# Patient Record
Sex: Male | Born: 1975 | Race: White | Hispanic: No | Marital: Married | Smoking: Never smoker
Health system: Southern US, Community
[De-identification: ages and names within clinical notes are randomized; demographics above are authoritative.]

## PROBLEM LIST (undated history)

## (undated) DIAGNOSIS — T7840XA Allergy, unspecified, initial encounter: Secondary | ICD-10-CM

## (undated) DIAGNOSIS — J45909 Unspecified asthma, uncomplicated: Secondary | ICD-10-CM

## (undated) HISTORY — DX: Allergy, unspecified, initial encounter: T78.40XA

## (undated) HISTORY — DX: Unspecified asthma, uncomplicated: J45.909

## (undated) HISTORY — PX: HERNIA REPAIR: SHX51

---

## 2012-10-13 ENCOUNTER — Ambulatory Visit (INDEPENDENT_AMBULATORY_CARE_PROVIDER_SITE_OTHER): Payer: BC Managed Care – PPO | Admitting: Internal Medicine

## 2012-10-13 VITALS — BP 140/92 | HR 95 | Temp 98.6°F | Resp 18 | Ht 71.0 in | Wt 258.0 lb

## 2012-10-13 DIAGNOSIS — H9202 Otalgia, left ear: Secondary | ICD-10-CM

## 2012-10-13 DIAGNOSIS — H6120 Impacted cerumen, unspecified ear: Secondary | ICD-10-CM

## 2012-10-13 DIAGNOSIS — H60399 Other infective otitis externa, unspecified ear: Secondary | ICD-10-CM

## 2012-10-13 DIAGNOSIS — H9209 Otalgia, unspecified ear: Secondary | ICD-10-CM

## 2012-10-13 DIAGNOSIS — J019 Acute sinusitis, unspecified: Secondary | ICD-10-CM

## 2012-10-13 DIAGNOSIS — Z6836 Body mass index (BMI) 36.0-36.9, adult: Secondary | ICD-10-CM | POA: Insufficient documentation

## 2012-10-13 DIAGNOSIS — H60392 Other infective otitis externa, left ear: Secondary | ICD-10-CM

## 2012-10-13 DIAGNOSIS — H6122 Impacted cerumen, left ear: Secondary | ICD-10-CM

## 2012-10-13 MED ORDER — NEOMYCIN-POLYMYXIN-HC 3.5-10000-1 OT SUSP: 3.0000 [drp] | Freq: Four times a day (QID) | OTIC | Status: AC

## 2012-10-13 MED ORDER — AMOXICILLIN 500 MG PO CAPS: 1000.0000 mg | ORAL_CAPSULE | Freq: Two times a day (BID) | ORAL | Status: AC

## 2012-10-13 MED ORDER — HYDROCODONE-HOMATROPINE 5-1.5 MG/5ML PO SYRP: 5.0000 mL | ORAL_SOLUTION | Freq: Four times a day (QID) | ORAL | Status: AC | PRN

## 2012-10-13 NOTE — Progress Notes (Signed)
  Subjective:    Patient ID: Drew Lewis, male    DOB: [birth year], 37 y.o.   MRN: [id number]  HPI complaining of one month history of progressively worsening congestion Started with a cough and a sore throat with fever for 5 days This improved but he continued with congestion and postnasal drainage and a nighttime cough/interferes with sleep Left frontal pain No night sweats Worked as a problem due to fatigue History of fall allergies on Zyrtec and Benadryl Over the past 3 days has had pain in his left ear with decreased hearing/has a history of frequent cerumen impaction in that ear  Review of Systems In the last week has had no fever chills or night sweats No bloody nasal discharge No wheezing Nonproductive cough No chest pain Positive fatigue/appetite okay No weight loss No GI symptoms    Objective:   Physical Exam BP 140/92  Pulse 95  Temp(Src) 98.6 F (37 C) (Oral)  Resp 18  Ht 5\' 11"  (1.803 m)  Wt 258 lb (117.028 kg)  BMI 36 kg/m2  SpO2 99% Pupils equal round reactive to light and accommodation/conjunctiva clear Left canal occluded with wax/right canal and TM clear Nares boggy with purulent Throat clear No nodes or thyromegaly Chest clear to auscultation No wheezing with forced expiration  Procedure= left ear irrigated successfully/post irrigation the canal is red and excoriated throughout/tympanic membrane is clear    Assessment & Plan:  Acute sinusitis, unspecified - Plan: amoxicillin (AMOXIL) 500 MG capsule  Otalgia of left ear - Plan: neomycin-polymyxin-hydrocortisone (CORTISPORIN) 3.5-10000-1 otic suspension  Cerumen impaction, left  Otitis, externa, infective, left - Plan: neomycin-polymyxin-hydrocortisone (CORTISPORIN) 3.5-10000-1 otic suspension  Meds ordered this encounter  Medications  . cetirizine (ZYRTEC) 10 MG tablet    Sig: Take 10 mg by mouth daily.  . DiphenhydrAMINE HCl (BENADRYL ALLERGY PO)    Sig: Take by mouth as needed.  Marland Kitchen  HYDROcodone-homatropine (HYCODAN) 5-1.5 MG/5ML syrup    Sig: Take 5 mLs by mouth every 6 (six) hours as needed for cough.    Dispense:  120 mL    Refill:  0  . amoxicillin (AMOXIL) 500 MG capsule    Sig: Take 2 capsules (1,000 mg total) by mouth 2 (two) times daily.    Dispense:  40 capsule    Refill:  0  . neomycin-polymyxin-hydrocortisone (CORTISPORIN) 3.5-10000-1 otic suspension    Sig: Place 3 drops into the left ear 4 (four) times daily.    Dispense:  10 mL    Refill:  0

## 2013-01-27 ENCOUNTER — Ambulatory Visit (INDEPENDENT_AMBULATORY_CARE_PROVIDER_SITE_OTHER): Payer: BC Managed Care – PPO | Admitting: Family Medicine

## 2013-01-27 VITALS — BP 126/80 | HR 79 | Temp 99.3°F | Resp 18 | Ht 71.0 in | Wt 258.0 lb

## 2013-01-27 DIAGNOSIS — R197 Diarrhea, unspecified: Secondary | ICD-10-CM

## 2013-01-27 DIAGNOSIS — A088 Other specified intestinal infections: Secondary | ICD-10-CM

## 2013-01-27 DIAGNOSIS — R109 Unspecified abdominal pain: Secondary | ICD-10-CM

## 2013-01-27 DIAGNOSIS — A084 Viral intestinal infection, unspecified: Secondary | ICD-10-CM

## 2013-01-27 DIAGNOSIS — R112 Nausea with vomiting, unspecified: Secondary | ICD-10-CM

## 2013-01-27 LAB — POCT CBC
Granulocyte percent: 62 %G (ref 37–80)
HEMATOCRIT: 51.5 % (ref 43.5–53.7)
Hemoglobin: 16.6 g/dL (ref 14.1–18.1)
LYMPH, POC: 2.5 (ref 0.6–3.4)
MCH: 28.8 pg (ref 27–31.2)
MCHC: 32.2 g/dL (ref 31.8–35.4)
MCV: 89.2 fL (ref 80–97)
MID (cbc): 0.8 (ref 0–0.9)
MPV: 8.8 fL (ref 0–99.8)
POC Granulocyte: 5.3 (ref 2–6.9)
POC LYMPH %: 29 % (ref 10–50)
POC MID %: 9 %M (ref 0–12)
Platelet Count, POC: 351 10*3/uL (ref 142–424)
RBC: 5.77 M/uL (ref 4.69–6.13)
RDW, POC: 13.1 %
WBC: 8.6 10*3/uL (ref 4.6–10.2)

## 2013-01-27 LAB — POCT URINALYSIS DIPSTICK
Glucose, UA: NEGATIVE
KETONES UA: NEGATIVE
Leukocytes, UA: NEGATIVE
NITRITE UA: NEGATIVE
PH UA: 5.5
Protein, UA: NEGATIVE
RBC UA: NEGATIVE
Spec Grav, UA: 1.025
Urobilinogen, UA: 0.2

## 2013-01-27 LAB — POCT UA - MICROSCOPIC ONLY
CRYSTALS, UR, HPF, POC: NEGATIVE
Casts, Ur, LPF, POC: NEGATIVE
EPITHELIAL CELLS, URINE PER MICROSCOPY: NEGATIVE
Yeast, UA: NEGATIVE

## 2013-01-27 NOTE — Patient Instructions (Signed)
Drink plenty of fluids. Nonalcoholic and non-dairy.  Return or go to the emergency room at anytime if acutely worse  Zofran dissolve one in mouth every 4-6 hours if needed for nausea and vomiting  Take Imodium if the diarrhea gets bad again  Return at any time if needed

## 2013-01-27 NOTE — Progress Notes (Signed)
Subjective: Generally healthy young man his started getting sick Saturday. He had diarrhea off and on over the last 4 days. It has not been constant diarrhea, just episodes of it. He has had some nonspecific abdominal pains. He has vomited heavily yesterday. He saw something look like blood in his vomitus. Today he has not eaten anything and has had neither vomiting or diarrhea. Only a sip of water. He has been drinking enough that he has been urinating.  Not on any regular medicines except for his Zyrtec and occasional Benadryl.  Objective: Particular man in no major distress. Moderately overweight. TMs normal the right common occluded with cerumen on the left. His throat has a little erythema along the left tonsillar pillar but his throat is not sore. He has had little runny nose. His neck is supple without nodes or thyromegaly. Chest is clear to auscultation. Heart regular without murmurs. Abdomen has normal bowel sounds, soft without organomegaly or masses but has mild generalized mid and low abdominal tenderness  Plan: CBC and urinalysis  Results for orders placed in visit on 01/27/13  POCT CBC      Result Value Range   WBC 8.6  4.6 - 10.2 K/uL   Lymph, poc 2.5  0.6 - 3.4   POC LYMPH PERCENT 29.0  10 - 50 %L   MID (cbc) 0.8  0 - 0.9   POC MID % 9.0  0 - 12 %M   POC Granulocyte 5.3  2 - 6.9   Granulocyte percent 62.0  37 - 80 %G   RBC 5.77  4.69 - 6.13 M/uL   Hemoglobin 16.6  14.1 - 18.1 g/dL   HCT, POC 16.151.5  09.643.5 - 53.7 %   MCV 89.2  80 - 97 fL   MCH, POC 28.8  27 - 31.2 pg   MCHC 32.2  31.8 - 35.4 g/dL   RDW, POC 04.513.1     Platelet Count, POC 351  142 - 424 K/uL   MPV 8.8  0 - 99.8 fL  POCT UA - MICROSCOPIC ONLY      Result Value Range   WBC, Ur, HPF, POC 0-2     RBC, urine, microscopic 0-1     Bacteria, U Microscopic trace     Mucus, UA moderate     Epithelial cells, urine per micros neg     Crystals, Ur, HPF, POC neg     Casts, Ur, LPF, POC neg     Yeast, UA neg    POCT  URINALYSIS DIPSTICK      Result Value Range   Color, UA yellow     Clarity, UA clear     Glucose, UA neg     Bilirubin, UA small     Ketones, UA neg     Spec Grav, UA 1.025     Blood, UA neg     pH, UA 5.5     Protein, UA neg     Urobilinogen, UA 0.2     Nitrite, UA neg     Leukocytes, UA Negative     Assessment: Gastroenteritis Vomiting Diarrhea Mild dehydration  Plan: Zofran Fluids Imodium if necessary

## 2014-01-25 ENCOUNTER — Ambulatory Visit (INDEPENDENT_AMBULATORY_CARE_PROVIDER_SITE_OTHER): Payer: BLUE CROSS/BLUE SHIELD

## 2014-01-25 ENCOUNTER — Ambulatory Visit (INDEPENDENT_AMBULATORY_CARE_PROVIDER_SITE_OTHER): Payer: BLUE CROSS/BLUE SHIELD | Admitting: Family Medicine

## 2014-01-25 VITALS — BP 128/90 | HR 100 | Temp 98.0°F | Resp 18 | Ht 71.0 in | Wt 270.0 lb

## 2014-01-25 DIAGNOSIS — Z91048 Other nonmedicinal substance allergy status: Secondary | ICD-10-CM

## 2014-01-25 DIAGNOSIS — J988 Other specified respiratory disorders: Secondary | ICD-10-CM

## 2014-01-25 DIAGNOSIS — J22 Unspecified acute lower respiratory infection: Secondary | ICD-10-CM

## 2014-01-25 DIAGNOSIS — R05 Cough: Secondary | ICD-10-CM

## 2014-01-25 DIAGNOSIS — R062 Wheezing: Secondary | ICD-10-CM

## 2014-01-25 DIAGNOSIS — Z9109 Other allergy status, other than to drugs and biological substances: Secondary | ICD-10-CM

## 2014-01-25 DIAGNOSIS — J0100 Acute maxillary sinusitis, unspecified: Secondary | ICD-10-CM

## 2014-01-25 DIAGNOSIS — R059 Cough, unspecified: Secondary | ICD-10-CM

## 2014-01-25 MED ORDER — FLUTICASONE PROPIONATE 50 MCG/ACT NA SUSP: 2.0000 | Freq: Every day | NASAL | Status: AC

## 2014-01-25 MED ORDER — ALBUTEROL SULFATE HFA 108 (90 BASE) MCG/ACT IN AERS: 2.0000 | INHALATION_SPRAY | Freq: Four times a day (QID) | RESPIRATORY_TRACT | Status: AC | PRN

## 2014-01-25 MED ORDER — AZITHROMYCIN 250 MG PO TABS: ORAL_TABLET | ORAL | Status: AC

## 2014-01-25 MED ORDER — HYDROCODONE-HOMATROPINE 5-1.5 MG/5ML PO SYRP
5.0000 mL | ORAL_SOLUTION | Freq: Every evening | ORAL | Status: AC | PRN
Start: 2014-01-25 — End: ?

## 2014-01-25 NOTE — Progress Notes (Signed)
Chief Complaint:  Chief Complaint  Patient presents with  . Cough    x3 weeks, dry and greenish mucous   . Nasal Congestion  . Generalized Body Aches    HPI: Drew Lewis is a 39 y.o. male who is here for 3 week history of cough, productive and dry. He also has some nausea, no vomiting yet, he had pains and aches everywhere initially , no fevers, has had chills.   He has asthma and allergies, he has been wheezing . He has tried  chlorascetin and now worse. HE has not taken his albuterol inhaler because it makes him very jittery and he states his asthma is under control, not like when he was a kid and he had to be in the hospital all the time. He is using albuterol abotu 3-4 tiems per month. NO wheezing currently.   Past Medical History  Diagnosis Date  . Asthma   . Allergy    Past Surgical History  Procedure Laterality Date  . Hernia repair     History   Social History  . Marital Status: Married    Spouse Name: N/A    Number of Children: N/A  . Years of Education: N/A   Social History Main Topics  . Smoking status: Never Smoker   . Smokeless tobacco: None  . Alcohol Use: Yes  . Drug Use: No  . Sexual Activity: None   Other Topics Concern  . None   Social History Narrative   Family History  Problem Relation Age of Onset  . Heart murmur Mother   . Diabetes Father   . Hypertension Father   . Cancer Father   . Migraines Daughter   . Schizophrenia Paternal Uncle   . Cancer Maternal Grandmother   . Lymphoma Maternal Grandmother   . Alzheimer's disease Maternal Grandfather   . Cancer Paternal Grandfather   . Leukemia Paternal Grandfather    No Known Allergies Prior to Admission medications   Medication Sig Start Date End Date Taking? Authorizing Provider  cetirizine (ZYRTEC) 10 MG tablet Take 10 mg by mouth daily.   Yes Historical Provider, MD  DiphenhydrAMINE HCl (BENADRYL ALLERGY PO) Take by mouth as needed.   Yes Historical Provider, MD    HYDROcodone-homatropine (HYCODAN) 5-1.5 MG/5ML syrup Take 5 mLs by mouth every 6 (six) hours as needed for cough. Patient not taking: Reported on 01/25/2014 10/13/12   Tonye Pearson, MD  neomycin-polymyxin-hydrocortisone (CORTISPORIN) 3.5-10000-1 otic suspension Place 3 drops into the left ear 4 (four) times daily. Patient not taking: Reported on 01/25/2014 10/13/12   Tonye Pearson, MD     ROS: The patient denies fevers, night sweats, unintentional weight loss,  palpitations, wheezing, dyspnea on exertion,  vomiting, abdominal pain, dysuria, hematuria, melena, numbness, weakness, or tingling.   All other systems have been reviewed and were otherwise negative with the exception of those mentioned in the HPI and as above.    PHYSICAL EXAM: Filed Vitals:   01/25/14 1305  BP: 128/90  Pulse: 100  Temp: 98 F (36.7 C)  Resp: 18   Filed Vitals:   01/25/14 1305  Height:  (1.803 m)  Weight: 270 lb (122.471 kg)   Body mass index is 37.67 kg/(m^2).  General: Alert, no acute distress HEENT:  Normocephalic, atraumatic, oropharynx patent. EOMI, PERRLA Erythematous throat, no exudates, TM normal, + sinus tenderness max, + erythematous/boggy nasal mucosa Cardiovascular:  Regular rate and rhythm, no rubs murmurs or gallops.  No Carotid bruits, radial pulse intact. No pedal edema.  Respiratory: Clear to auscultation bilaterally.  No wheezes, rales, or rhonchi.  No cyanosis, no use of accessory musculature GI: No organomegaly, abdomen is soft and non-tender, positive bowel sounds.  No masses. Skin: No rashes. Neurologic: Facial musculature symmetric. Psychiatric: Patient is appropriate throughout our interaction. Lymphatic: No cervical lymphadenopathy Musculoskeletal: Gait intact.   LABS: Results for orders placed or performed in visit on 01/27/13  POCT CBC  Result Value Ref Range   WBC 8.6 4.6 - 10.2 K/uL   Lymph, poc 2.5 0.6 - 3.4   POC LYMPH PERCENT 29.0 10 - 50 %L   MID  (cbc) 0.8 0 - 0.9   POC MID % 9.0 0 - 12 %M   POC Granulocyte 5.3 2 - 6.9   Granulocyte percent 62.0 37 - 80 %G   RBC 5.77 4.69 - 6.13 M/uL   Hemoglobin 16.6 14.1 - 18.1 g/dL   HCT, POC 10.251.5 72.543.5 - 53.7 %   MCV 89.2 80 - 97 fL   MCH, POC 28.8 27 - 31.2 pg   MCHC 32.2 31.8 - 35.4 g/dL   RDW, POC 36.613.1 %   Platelet Count, POC 351 142 - 424 K/uL   MPV 8.8 0 - 99.8 fL  POCT UA - Microscopic Only  Result Value Ref Range   WBC, Ur, HPF, POC 0-2    RBC, urine, microscopic 0-1    Bacteria, U Microscopic trace    Mucus, UA moderate    Epithelial cells, urine per micros neg    Crystals, Ur, HPF, POC neg    Casts, Ur, LPF, POC neg    Yeast, UA neg   POCT urinalysis dipstick  Result Value Ref Range   Color, UA yellow    Clarity, UA clear    Glucose, UA neg    Bilirubin, UA small    Ketones, UA neg    Spec Grav, UA 1.025    Blood, UA neg    pH, UA 5.5    Protein, UA neg    Urobilinogen, UA 0.2    Nitrite, UA neg    Leukocytes, UA Negative      EKG/XRAY:   Primary [facility]. Right hazy iniftrate vs vascular changes due to bronchitic changes + horizontal fissue without e/o effusion   ASSESSMENT/PLAN: Encounter Diagnoses  Name Primary?  . Acute maxillary sinusitis, recurrence not specified Yes  . Cough   . Wheezing   . Environmental allergies   . Lower respiratory infection (e.g., bronchitis, pneumonia, pneumonitis, pulmonitis)    Rx azithromycin, flonase Rx hycodan, albuterol inhaler, he did not want nebs in the office otc delsyn/mucinex  Gross sideeffects, risk and benefits, and alternatives of medications d/w patient. Patient is aware that all medications have potential sideeffects and we are unable to predict every sideeffect or drug-drug interaction that may occur.  [name], Daena Alper PHUONG, DO 01/25/2014 3:19 PM

## 2014-01-25 NOTE — Patient Instructions (Signed)

## 2016-01-15 IMAGING — CR DG CHEST 2V
2 series · 2 of 2 positions shown · non-contrast
Comparison: None.

CLINICAL DATA: Shortness of breath cough and congestion

EXAM:
CHEST  2 VIEW

[PA]
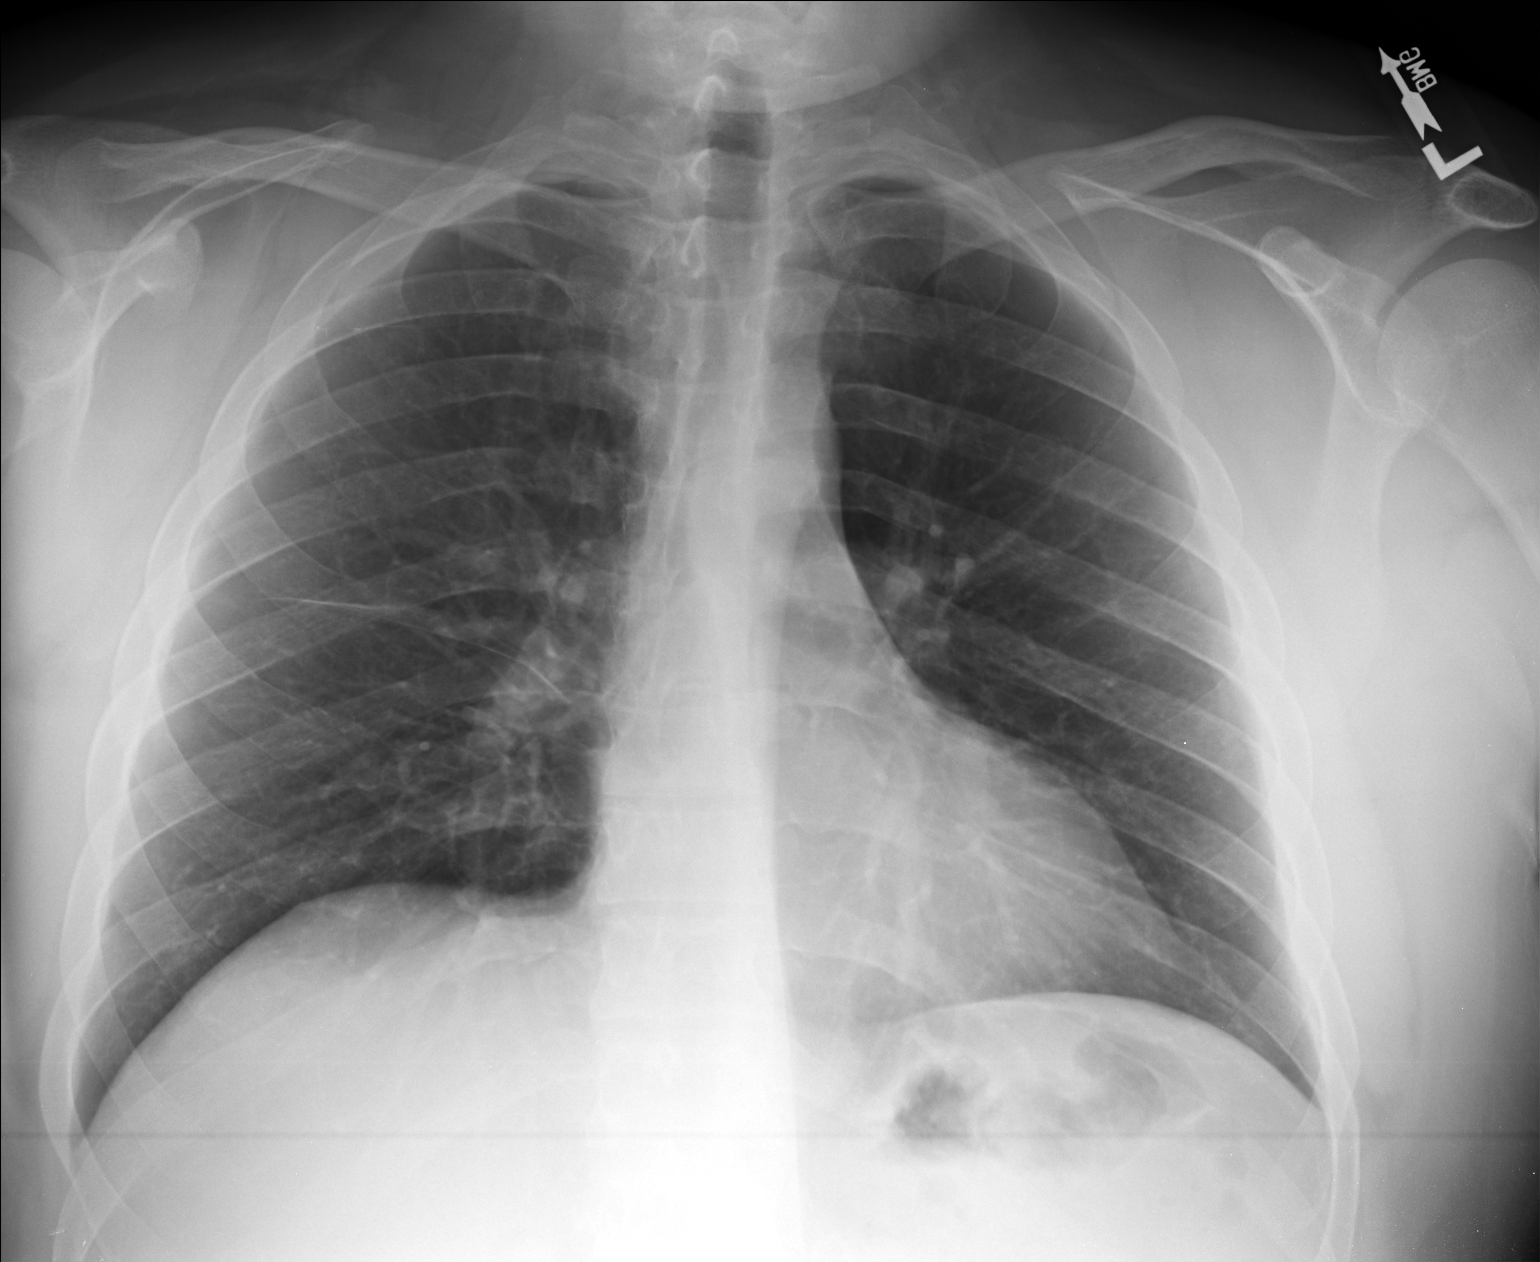

[lateral]
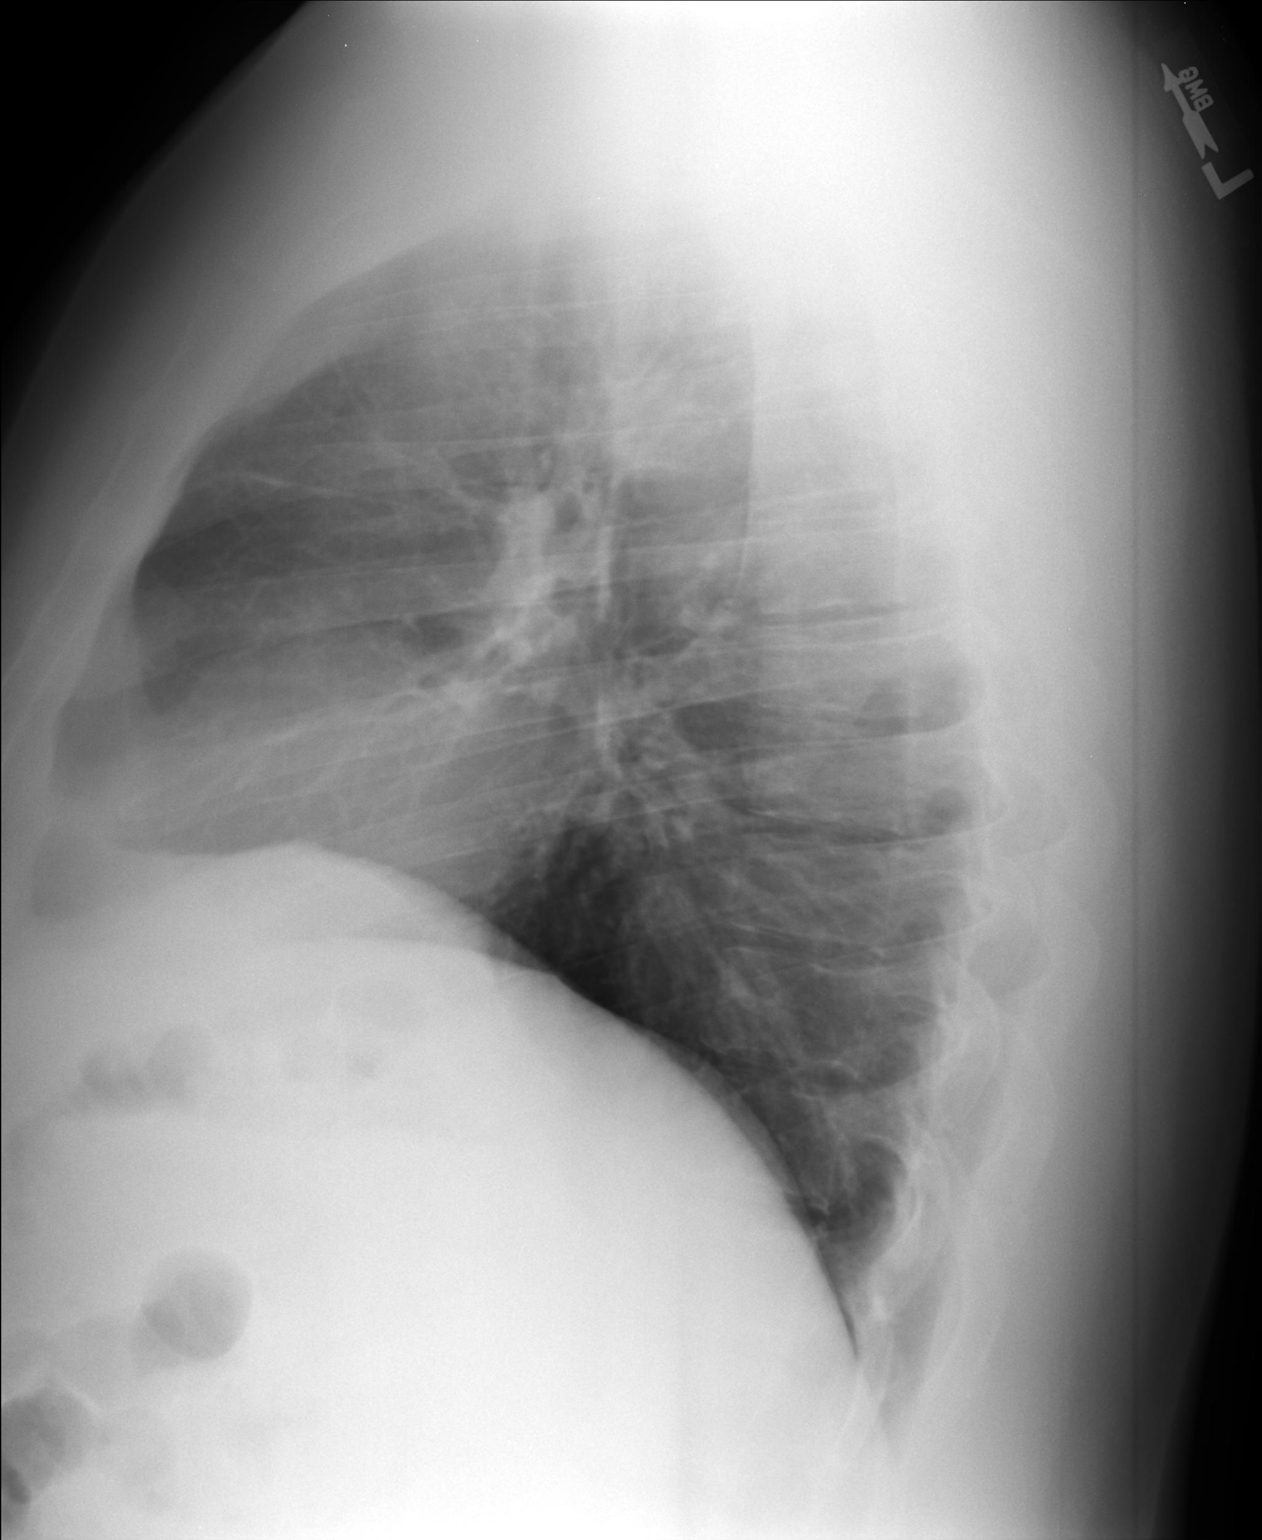

[2 of 2 positions shown; findings below may reference images not displayed]

FINDINGS: The lungs are adequately inflated. Is linear density in the right
perihilar region which lies anteriorly on the lateral film and
likely reflects thickening of the minor fissure. There is no
alveolar infiltrate. There is no pleural effusion or pneumothorax.
The heart and pulmonary vascularity are normal. The trachea is
midline. The bony thorax is unremarkable.
IMPRESSION: Mild thickening of the minor fissure versus subsegmental atelectasis
on the right. There is no evidence of pneumonia.
